# Patient Record
Sex: Female | Born: 1995 | Race: Black or African American | Hispanic: No | Marital: Single | Smoking: Current every day smoker
Health system: Southern US, Community
[De-identification: ages and names within clinical notes are randomized; demographics above are authoritative.]

---

## 2014-02-18 ENCOUNTER — Encounter (HOSPITAL_COMMUNITY): Payer: Self-pay

## 2014-02-18 ENCOUNTER — Emergency Department (HOSPITAL_COMMUNITY): Payer: Medicaid Other

## 2014-02-18 ENCOUNTER — Emergency Department (HOSPITAL_COMMUNITY)
Admission: EM | Admit: 2014-02-18 | Discharge: 2014-02-18 | Disposition: A | Discharge status: Home / Self Care | Payer: Medicaid Other | Attending: Emergency Medicine | Admitting: Emergency Medicine

## 2014-02-18 DIAGNOSIS — M25462 Effusion, left knee: Secondary | ICD-10-CM | POA: Insufficient documentation

## 2014-02-18 DIAGNOSIS — Z720 Tobacco use: Secondary | ICD-10-CM | POA: Insufficient documentation

## 2014-02-18 MED ORDER — NAPROSYN 500 MG PO TABS: 500.0000 mg | ORAL_TABLET | Freq: Two times a day (BID) | ORAL | Status: AC

## 2014-02-18 NOTE — ED Provider Notes (Signed)
CSN: [id number]     Arrival date & time 02/18/14  1048 History  This chart was scribed for Arthor CaptainAbigail Oreta Soloway, PA-C, working with Doug SouSam Jacubowitz, MD by Chestine SporeSoijett Blue, ED Scribe. The patient was seen in room TR07C/TR07C at 11:24 AM.    Chief Complaint  Patient presents with  . Knee Pain    The history is provided by the patient. No language interpreter was used.   HPI Comments: Bianca Davila is a 18 y.o. female who presents to the Emergency Department complaining of left knee pain onset 1 week ago. She reports that she fell on the knee. She was rolled out of bed and fell on her knee. There wa no initial pain but there was pain the net day and swelling. For the past week her pain has been constant and unchanged.  She is able to walk on it but there is pain when she moves the knee and stands for a long time. She states that she is having associated symptoms of joint swelling.  She denies any other symptoms.  History reviewed. No pertinent past medical history. History reviewed. No pertinent past surgical history. No family history on file. History  Substance Use Topics  . Smoking status: Current Every Day Smoker  . Smokeless tobacco: Not on file  . Alcohol Use: No   OB History    No data available     Review of Systems  Musculoskeletal: Positive for joint swelling and arthralgias.      Allergies  Review of patient's allergies indicates no known allergies.  Home Medications   Prior to Admission medications   Not on File   BP 117/73 mmHg  Pulse 75  Temp(Src) 98 F (36.7 C) (Oral)  Resp 15  Ht 5\' 5"  (1.651 m)  Wt 119 lb 2 oz (54.035 kg)  BMI 19.82 kg/m2  SpO2 96%  LMP 01/28/2014  Physical Exam  Constitutional: She is oriented to person, place, and time. She appears well-developed and well-nourished. No distress.  HENT:  Head: Normocephalic and atraumatic.  Eyes: EOM are normal.  Neck: Neck supple. No tracheal deviation present.  Cardiovascular: Normal rate.    Pulmonary/Chest: Effort normal. No respiratory distress.  Musculoskeletal: Normal range of motion.       Left knee: She exhibits swelling.  Posterior fascia swelling. Swelling above the knee cap and the supra patellar region. Can extend to 180 degrees. Full mobility of the left knee.   Neurological: She is alert and oriented to person, place, and time.  Skin: Skin is warm and dry.  Psychiatric: She has a normal mood and affect. Her behavior is normal.  Nursing note and vitals reviewed.   ED Course  Procedures (including critical care time) DIAGNOSTIC STUDIES: Oxygen Saturation is 96% on room air, normal by my interpretation.    COORDINATION OF CARE: 11:29 AM-Discussed treatment plan which includes left knee X-ray with pt at bedside and pt agreed to plan.   Labs Review Labs Reviewed - No data to display  Imaging Review Dg Knee Complete 4 Views Left  02/18/2014   CLINICAL DATA:  Pain and swelling of 1 week duration. Twisting injury getting out of day head with fall. Anterior and lateral side pain and swelling.  EXAM: LEFT KNEE - COMPLETE 4+ VIEW  COMPARISON:  None.  FINDINGS: There is a knee joint effusion. No evidence of fracture, dislocation or degenerative change.  IMPRESSION: Knee joint effusion.  No other radiographic finding.   Electronically Signed   By: Loraine LericheMark  Shogry M.D.   On: 02/18/2014 12:20     EKG Interpretation None      MDM   Final diagnoses:  Knee effusion, left   Patient X-Ray negative for obvious fracture or dislocation+ for joint effusion. Pain managed in ED. Pt advised to follow up with orthopedics if symptoms persist for possibility of missed fracture diagnosis. Patient given brace while in ED, conservative therapy recommended and discussed. Patient will be dc home & is agreeable with above plan.   I personally performed the services described in this documentation, which was scribed in my presence. The recorded information has been reviewed and is accurate.     Arthor Captainbigail Mathea Frieling, PA-C 02/18/14 1233  Doug SouSam Jacubowitz, MD 02/18/14 [id number]

## 2014-02-18 NOTE — Discharge Instructions (Signed)

## 2014-02-18 NOTE — ED Notes (Addendum)
Pt states she fell a week ago.  Pt c/o L knee pain and swelling.  Pt ambulates without difficulty.

## 2016-06-17 IMAGING — CR DG KNEE COMPLETE 4+V*L*
4 series · 4 of 4 positions shown · non-contrast
Comparison: None.

CLINICAL DATA: Pain and swelling of 1 week duration. Twisting
injury getting out of day head with fall. Anterior and lateral side
pain and swelling.

EXAM:
LEFT KNEE - COMPLETE 4+ VIEW

[knee ap]
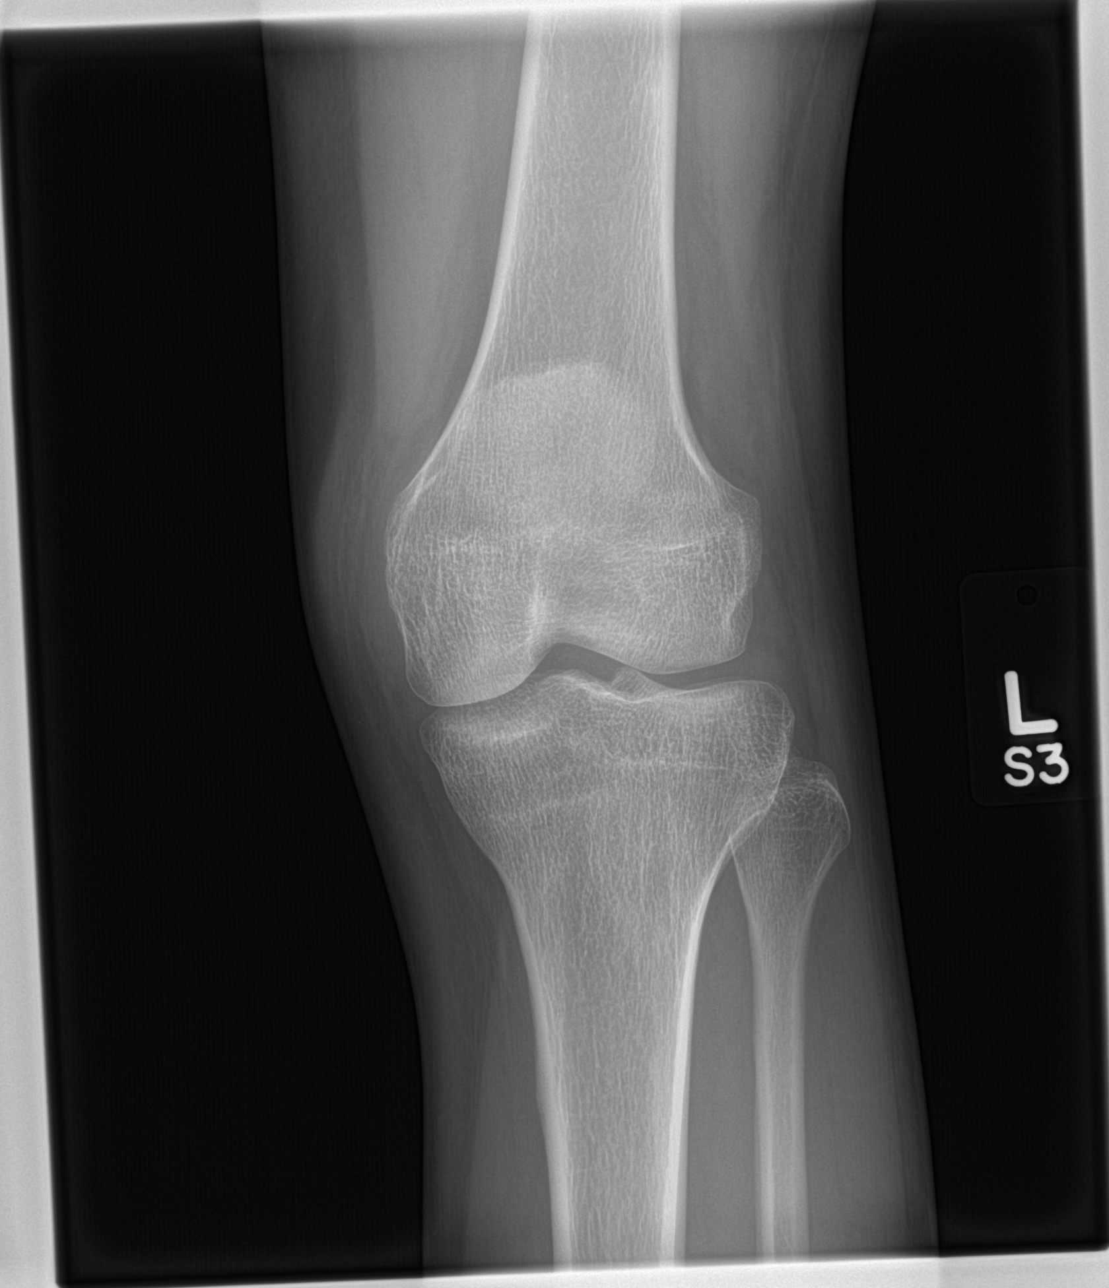

[knee obl (1 of 2)]
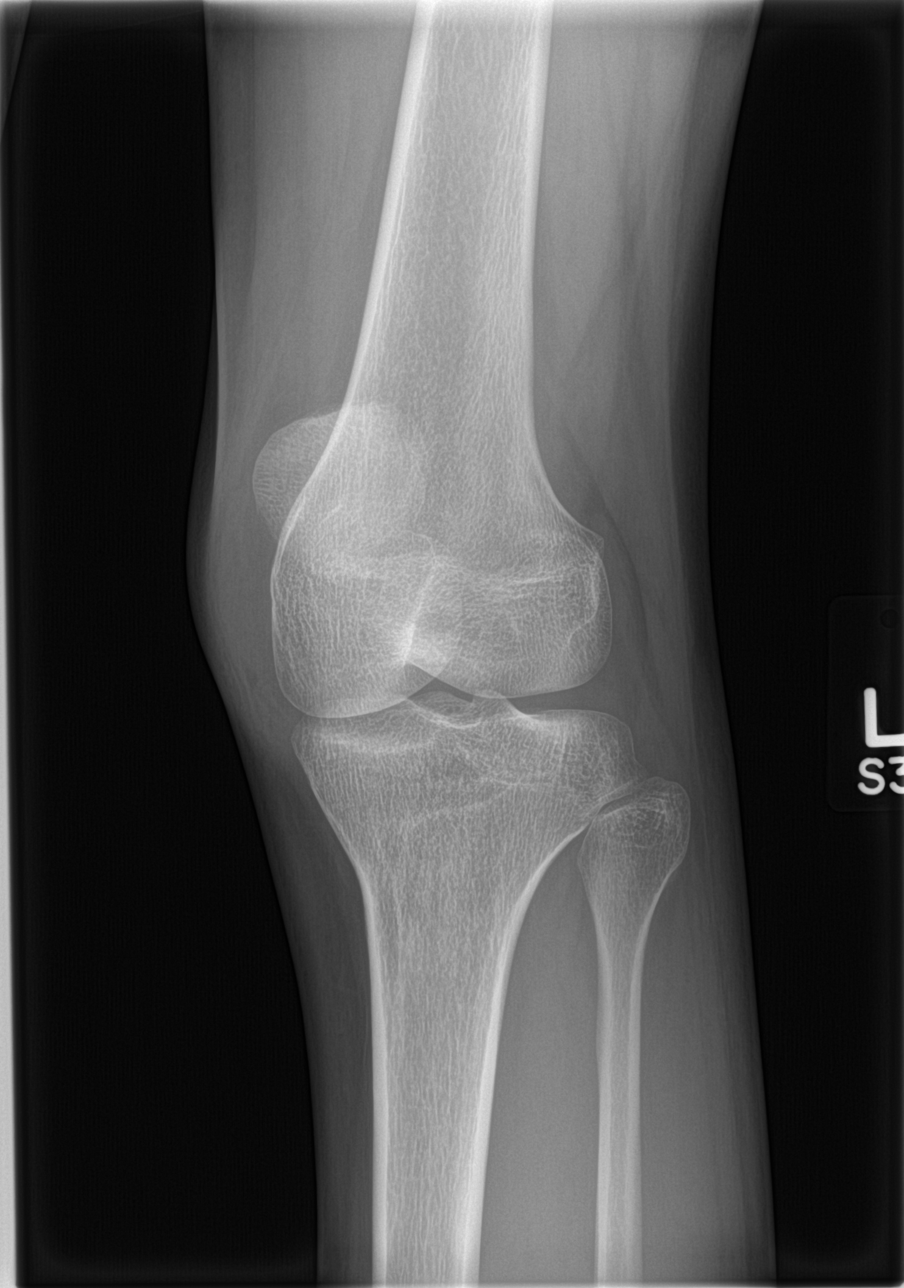

[knee obl (2 of 2)]
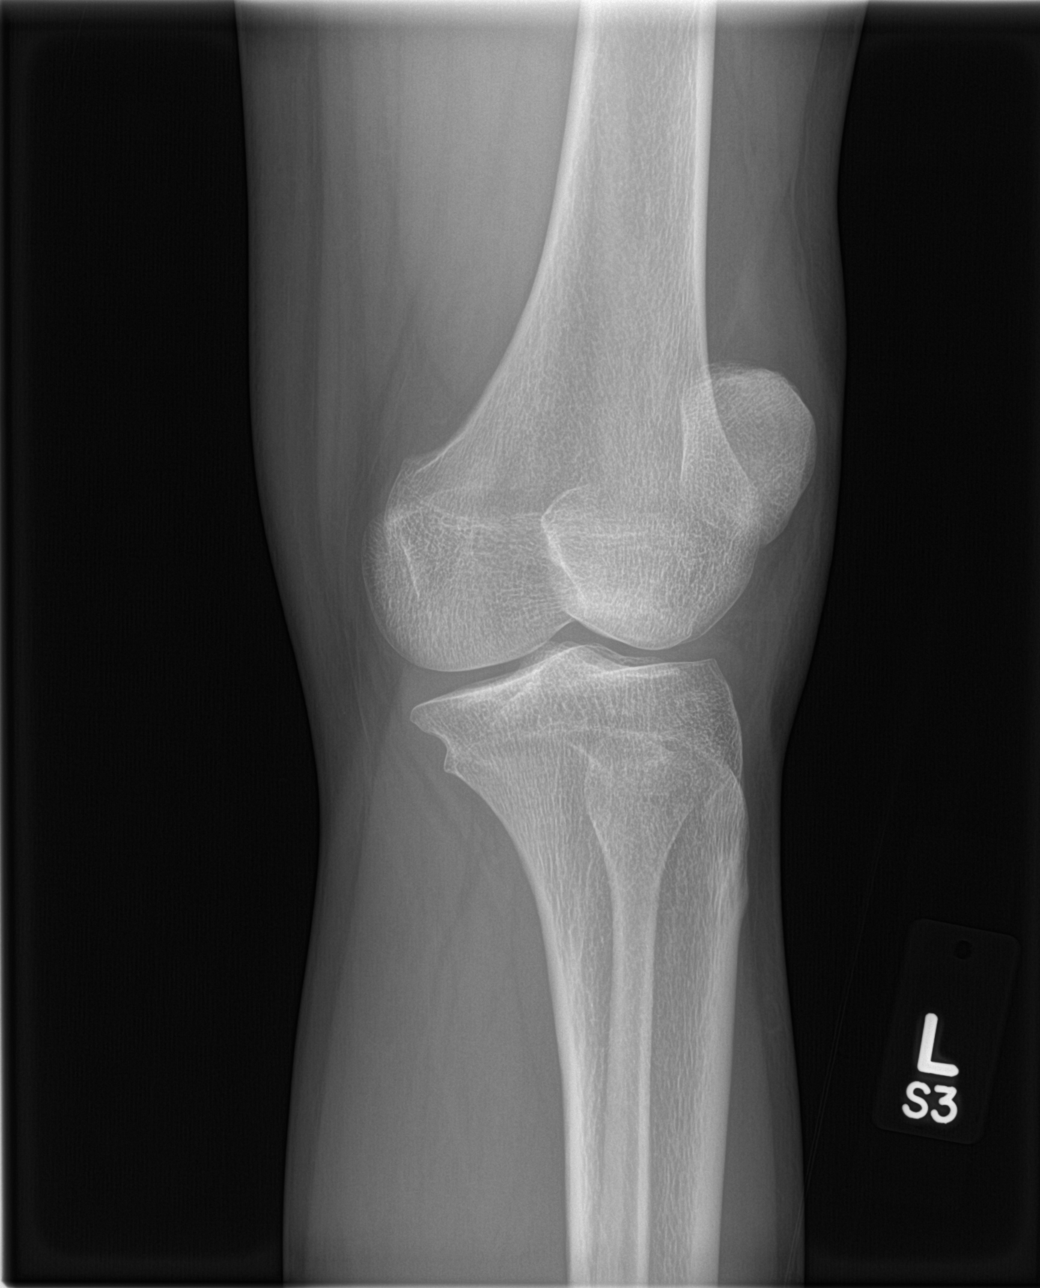

[knee lat]
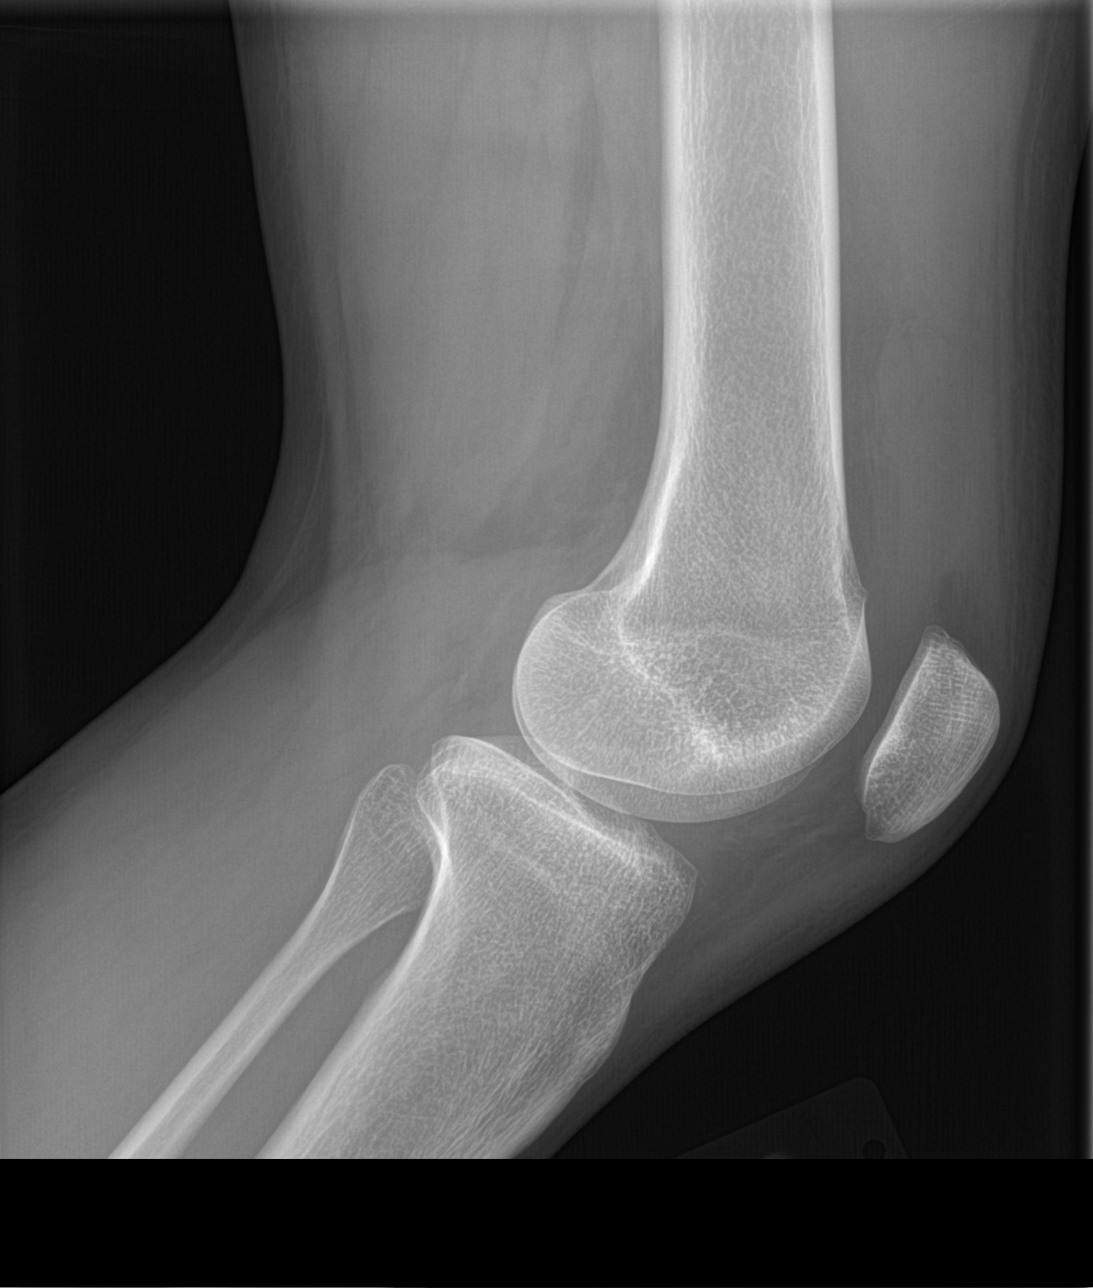

[4 of 4 positions shown; findings below may reference images not displayed]

FINDINGS: There is a knee joint effusion. No evidence of fracture, dislocation
or degenerative change.
IMPRESSION: Knee joint effusion.  No other radiographic finding.
